# Patient Record
Sex: Male | Born: 2005
Health system: Southern US, Community
[De-identification: ages and names within clinical notes are randomized; demographics above are authoritative.]

---
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
## 2023-08-12 ENCOUNTER — Emergency Department (HOSPITAL_COMMUNITY)
Admission: EM | Admit: 2023-08-12 | Discharge: 2023-08-13 | Disposition: A | Discharge status: Home / Self Care | Payer: Medicaid Other | Attending: Emergency Medicine | Admitting: Emergency Medicine

## 2023-08-12 ENCOUNTER — Encounter (HOSPITAL_COMMUNITY): Payer: Self-pay

## 2023-08-12 ENCOUNTER — Emergency Department (HOSPITAL_COMMUNITY): Payer: Medicaid Other

## 2023-08-12 DIAGNOSIS — S0993XA Unspecified injury of face, initial encounter: Secondary | ICD-10-CM | POA: Diagnosis present

## 2023-08-12 DIAGNOSIS — S0081XA Abrasion of other part of head, initial encounter: Secondary | ICD-10-CM | POA: Insufficient documentation

## 2023-08-12 DIAGNOSIS — Y92410 Unspecified street and highway as the place of occurrence of the external cause: Secondary | ICD-10-CM | POA: Diagnosis not present

## 2023-08-12 LAB — COMPREHENSIVE METABOLIC PANEL
ALT: 38 U/L (ref 0–44)
AST: 28 U/L (ref 15–41)
Albumin: 4.2 g/dL (ref 3.5–5.0)
Alkaline Phosphatase: 66 U/L (ref 38–126)
Anion gap: 10 (ref 5–15)
BUN: <5 mg/dL — ABNORMAL LOW (ref 6–20)
CO2: 23 mmol/L (ref 22–32)
Calcium: 9.2 mg/dL (ref 8.9–10.3)
Chloride: 105 mmol/L (ref 98–111)
Creatinine, Ser: 0.76 mg/dL (ref 0.61–1.24)
GFR, Estimated: >60 mL/min (ref >60)
Glucose, Bld: 91 mg/dL (ref 70–99)
Potassium: 3.9 mmol/L (ref 3.5–5.1)
Sodium: 138 mmol/L (ref 135–145)
Total Bilirubin: 0.7 mg/dL (ref 0.3–1.2)
Total Protein: 6.7 g/dL (ref 6.5–8.1)

## 2023-08-12 LAB — I-STAT CG4 LACTIC ACID, ED: Lactic Acid, Venous: 1 mmol/L (ref 0.5–1.9)

## 2023-08-12 LAB — CBC
HCT: 41.7 % (ref 39.0–52.0)
Hemoglobin: 13.3 g/dL (ref 13.0–17.0)
MCH: 24.3 pg — ABNORMAL LOW (ref 26.0–34.0)
MCHC: 31.9 g/dL (ref 30.0–36.0)
MCV: 76.2 fL — ABNORMAL LOW (ref 80.0–100.0)
Platelets: 218 10*3/uL (ref 150–400)
RBC: 5.47 MIL/uL (ref 4.22–5.81)
RDW: 14.7 % (ref 11.5–15.5)
WBC: 8.4 10*3/uL (ref 4.0–10.5)
nRBC: 0 % (ref 0.0–0.2)

## 2023-08-12 LAB — I-STAT CHEM 8, ED
BUN: 5 mg/dL — ABNORMAL LOW (ref 6–20)
Calcium, Ion: 1.16 mmol/L (ref 1.15–1.40)
Chloride: 105 mmol/L (ref 98–111)
Creatinine, Ser: 0.7 mg/dL (ref 0.61–1.24)
Glucose, Bld: 89 mg/dL (ref 70–99)
HCT: 43 % (ref 39.0–52.0)
Hemoglobin: 14.6 g/dL (ref 13.0–17.0)
Potassium: 3.9 mmol/L (ref 3.5–5.1)
Sodium: 140 mmol/L (ref 135–145)
TCO2: 23 mmol/L (ref 22–32)

## 2023-08-12 LAB — TYPE AND SCREEN
ABO/RH(D): O POS
Antibody Screen: NEGATIVE

## 2023-08-12 LAB — ABO/RH: ABO/RH(D): O POS

## 2023-08-12 MED ORDER — ACETAMINOPHEN 500 MG PO TABS
1000.0000 mg | ORAL_TABLET | Freq: Once | ORAL | Status: AC
  Administered 2023-08-12: 1000 mg via ORAL
  Filled 2023-08-12: qty 2

## 2023-08-12 MED ORDER — IOHEXOL 350 MG/ML SOLN
75.0000 mL | Freq: Once | INTRAVENOUS | Status: AC | PRN
  Administered 2023-08-12: 75 mL via INTRAVENOUS

## 2023-08-12 MED ORDER — IBUPROFEN 800 MG PO TABS
800.0000 mg | ORAL_TABLET | Freq: Once | ORAL | Status: AC
  Administered 2023-08-12: 800 mg via ORAL
  Filled 2023-08-12: qty 1
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
## 2023-08-13 MED ORDER — NAPROXEN 500 MG PO TABS: 500.0000 mg | ORAL_TABLET | Freq: Two times a day (BID) | ORAL | 0 refills | Status: AC

## 2023-08-13 MED ORDER — METHOCARBAMOL 500 MG PO TABS: 500.0000 mg | ORAL_TABLET | Freq: Two times a day (BID) | ORAL | 0 refills | Status: AC

## 2023-08-13 NOTE — Discharge Instructions (Signed)
Take the prescribed medication as directed. Can use heat/ice as needed for added pain relief. Return to the ED for new or worsening symptoms.
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
## 2023-11-26 ENCOUNTER — Other Ambulatory Visit: Payer: Self-pay

## 2023-11-26 ENCOUNTER — Emergency Department
Admission: EM | Admit: 2023-11-26 | Discharge: 2023-11-26 | Disposition: A | Discharge status: Home / Self Care | Payer: Medicaid Other | Attending: Emergency Medicine | Admitting: Emergency Medicine

## 2023-11-26 ENCOUNTER — Emergency Department: Payer: Medicaid Other

## 2023-11-26 DIAGNOSIS — J111 Influenza due to unidentified influenza virus with other respiratory manifestations: Secondary | ICD-10-CM

## 2023-11-26 DIAGNOSIS — R058 Other specified cough: Secondary | ICD-10-CM | POA: Diagnosis present

## 2023-11-26 DIAGNOSIS — J101 Influenza due to other identified influenza virus with other respiratory manifestations: Secondary | ICD-10-CM | POA: Insufficient documentation

## 2023-11-26 DIAGNOSIS — Z20822 Contact with and (suspected) exposure to covid-19: Secondary | ICD-10-CM | POA: Diagnosis not present

## 2023-11-26 DIAGNOSIS — M549 Dorsalgia, unspecified: Secondary | ICD-10-CM | POA: Diagnosis not present

## 2023-11-26 LAB — URINALYSIS, ROUTINE W REFLEX MICROSCOPIC
Bacteria, UA: NONE SEEN
Bilirubin Urine: NEGATIVE
Glucose, UA: NEGATIVE mg/dL
Hgb urine dipstick: NEGATIVE
Ketones, ur: 20 mg/dL — AB
Leukocytes,Ua: NEGATIVE
Nitrite: NEGATIVE
Protein, ur: 30 mg/dL — AB
Specific Gravity, Urine: 1.027 (ref 1.005–1.030)
pH: 5 (ref 5.0–8.0)

## 2023-11-26 LAB — RESP PANEL BY RT-PCR (RSV, FLU A&B, COVID)  RVPGX2
Influenza A by PCR: POSITIVE — AB
Influenza B by PCR: NEGATIVE
Resp Syncytial Virus by PCR: NEGATIVE
SARS Coronavirus 2 by RT PCR: NEGATIVE

## 2023-11-26 MED ORDER — IBUPROFEN 800 MG PO TABS: 800.0000 mg | ORAL_TABLET | Freq: Three times a day (TID) | ORAL | 0 refills | Status: AC | PRN

## 2023-11-26 MED ORDER — GUAIFENESIN ER 600 MG PO TB12: 600.0000 mg | ORAL_TABLET | Freq: Two times a day (BID) | ORAL | 0 refills | Status: AC

## 2023-11-26 NOTE — ED Triage Notes (Signed)
Pt sts for the last two days pt has been having head ache with productive cough and sweats.

## 2023-11-26 NOTE — Discharge Instructions (Signed)
You have been diagnosed with influenza A. Please take Ibuprofen as directed, after main meals. Drink plenty of fluids. Come back if new symptoms or worsen symptoms. Please take mucinex for cough  2 times a day.

## 2023-11-26 NOTE — ED Provider Notes (Signed)
[facility] Provider Note    Event Date/Time   First MD Initiated Contact with Patient 11/26/23 1310     (approximate)   History   Influenza   HPI  Clarence Carter is a 18 y.o. male with history of productive cough, fever, chills, nasal congestion, back pain, headache.. Denies sick contacts.       Physical Exam   Triage Vital Signs: ED Triage Vitals  Encounter Vitals Group     BP 11/26/23 1223 119/72     Systolic BP Percentile --      Diastolic BP Percentile --      Pulse Rate 11/26/23 1223 94     Resp 11/26/23 1223 18     Temp 11/26/23 1223 98.8 F (37.1 C)     Temp Source 11/26/23 1223 Oral     SpO2 11/26/23 1223 96 %     Weight 11/26/23 1224 130 lb (59 kg)     Height 11/26/23 1224 5\' 7"  (1.702 m)     Head Circumference --      Peak Flow --      Pain Score 11/26/23 1224 7     Pain Loc --      Pain Education --      Exclude from Growth Chart --     Most recent vital signs: Vitals:   11/26/23 1223  BP: 119/72  Pulse: 94  Resp: 18  Temp: 98.8 F (37.1 C)  SpO2: 96%     Constitutional: Alert , mild distress Eyes: Conjunctivae are normal.  Head: Atraumatic. Nose: No congestion/rhinnorhea. Mouth/Throat: Mucous membranes are moist.  Tonsillar erythema Neck: Painless ROM.  Cardiovascular:   Good peripheral circulation. Respiratory: Normal respiratory effort.  No retractions. No wheezing. Mobilizations of secretions  Gastrointestinal: Soft and nontender.  Musculoskeletal:  no deformity Left lumbar: Tender to percussion. Neurologic:  MAE spontaneously. No gross focal neurologic deficits are appreciated.  Skin:  Skin is warm, dry and intact. No rash noted. Psychiatric: Mood and affect are normal. Speech and behavior are normal.    ED Results / Procedures / Treatments   Labs (all labs ordered are listed, but only abnormal results are displayed) Labs Reviewed  RESP PANEL BY RT-PCR (RSV, FLU A&B, COVID)  [id number] - Abnormal;  Notable for the following components:      Result Value   Influenza A by PCR POSITIVE (*)    All other components within normal limits  URINALYSIS, ROUTINE W REFLEX MICROSCOPIC - Abnormal; Notable for the following components:   Color, Urine YELLOW (*)    APPearance CLEAR (*)    Ketones, ur 20 (*)    Protein, ur 30 (*)    All other components within normal limits     EKG     RADIOLOGY I independently reviewed and interpreted imaging and agree with radiologists findings.      PROCEDURES:  Critical Care performed:   Procedures   MEDICATIONS ORDERED IN ED: Medications - No data to display   IMPRESSION / MDM / ASSESSMENT AND PLAN / ED COURSE  I reviewed the triage vital signs and the nursing notes.  Differential diagnosis includes, but is not limited to, covid, flu, pneumonia, influenza,  Patient's presentation is most consistent with acute complicated illness / injury requiring diagnostic workup.   Patient's diagnosis is consistent with influenza A. I independently reviewed and interpreted imaging and agree with radiologists findings. Labs are rea reassuring. I did review the patient's allergies and medications. Patient will  be discharged home with prescriptions for Ibuprofen, tylenol. Patient is to follow up with PCP as needed or otherwise directed. Patient is given ED precautions to return to the ED for any worsening or new symptoms. Discussed plan of care with patient, answered all of patient's questions, Patient agreeable to plan of care. Advised patient to take medications according to the instructions on the label. Discussed possible side effects of new medications. Patient verbalized understanding. Clinical Course as of 11/26/23 1440  Sun Nov 26, 2023  1429 Resp panel by RT-PCR (RSV, Flu A&B, Covid) Anterior Nasal Swab(!)  influenza A positive [AE]  1431 DG Chest 2 View Normal Chest x-ray [AE]    Clinical Course User Index [AE] Gladys Damme, PA-C      FINAL CLINICAL IMPRESSION(S) / ED DIAGNOSES   Final diagnoses:  Influenza     Rx / DC Orders   ED Discharge Orders          Ordered    ibuprofen (ADVIL) 800 MG tablet  Every 8 hours PRN        11/26/23 1439    guaiFENesin (MUCINEX) 600 MG 12 hr tablet  2 times daily        11/26/23 1439             Note:  This document was prepared using Dragon voice recognition software and may include unintentional dictation errors.   Gladys Damme, PA-C 11/26/23 1440    Janith Lima, MD 11/26/23 Ebony Cargo

## 2023-11-26 NOTE — ED Notes (Signed)
See triage note  Presents with body aches and cough which started couple of days ago  Afebrile on arrival
